# Patient Record
Sex: Female | Born: 2000 | Race: Black or African American | Hispanic: No | Marital: Single | Smoking: Never smoker
Health system: Southern US, Community
[De-identification: ages and names within clinical notes are randomized; demographics above are authoritative.]

## PROBLEM LIST (undated history)

## (undated) ENCOUNTER — Inpatient Hospital Stay (HOSPITAL_COMMUNITY): Payer: Self-pay

## (undated) ENCOUNTER — Emergency Department (HOSPITAL_COMMUNITY): Admission: EM

## (undated) DIAGNOSIS — R51 Headache: Secondary | ICD-10-CM

## (undated) DIAGNOSIS — T7840XA Allergy, unspecified, initial encounter: Secondary | ICD-10-CM

## (undated) DIAGNOSIS — R519 Headache, unspecified: Secondary | ICD-10-CM

## (undated) HISTORY — DX: Headache: R51

## (undated) HISTORY — PX: NO PAST SURGERIES: SHX2092

## (undated) HISTORY — DX: Headache, unspecified: R51.9

## (undated) HISTORY — DX: Allergy, unspecified, initial encounter: T78.40XA

---
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
## 2018-01-01 MED ORDER — TERCONAZOLE 0.8 % VA CREA: 1.0000 | TOPICAL_CREAM | Freq: Every day | VAGINAL | 0 refills | Status: DC

## 2018-01-01 NOTE — Telephone Encounter (Signed)
Please review for refill.  

## 2018-02-05 ENCOUNTER — Ambulatory Visit: Payer: Medicaid Other

## 2018-02-08 ENCOUNTER — Ambulatory Visit (INDEPENDENT_AMBULATORY_CARE_PROVIDER_SITE_OTHER): Payer: Medicaid Other | Admitting: *Deleted

## 2018-02-08 VITALS — BP 118/72 | HR 72 | Wt 120.0 lb

## 2018-02-08 DIAGNOSIS — Z3042 Encounter for surveillance of injectable contraceptive: Secondary | ICD-10-CM

## 2018-02-08 NOTE — Progress Notes (Signed)
Pt is in office for depo injection. Pt is on time for depo.  Pt provided depo for today's visit. Pt tolerated injection well.   Pt advised to RTO July 25-Aug 8 for next depo.  Pt has no other concerns today.  BP 118/72   Pulse 72   Wt 120 lb (54.4 kg)   BMI 24.24 kg/m   Administrations This Visit    medroxyPROGESTERone (DEPO-PROVERA) injection 150 mg    Admin Date 02/08/2018 Action Given Dose 150 mg Route Intramuscular Administered By Lanney Gins, CMA

## 2018-02-12 NOTE — Progress Notes (Signed)
I have reviewed the chart and agree with nursing staff's documentation of this patient's encounter.  Leighton Brickley A Heidy Mccubbin, CNM 02/12/2018 9:03 AM    

## 2018-04-30 ENCOUNTER — Ambulatory Visit: Payer: Medicaid Other

## 2018-05-03 ENCOUNTER — Ambulatory Visit (INDEPENDENT_AMBULATORY_CARE_PROVIDER_SITE_OTHER): Payer: Medicaid Other

## 2018-05-03 DIAGNOSIS — R300 Dysuria: Secondary | ICD-10-CM | POA: Diagnosis not present

## 2018-05-03 DIAGNOSIS — Z3042 Encounter for surveillance of injectable contraceptive: Secondary | ICD-10-CM | POA: Diagnosis not present

## 2018-05-03 LAB — POCT URINALYSIS DIPSTICK
BILIRUBIN UA: NEGATIVE
GLUCOSE UA: NEGATIVE
Ketones, UA: POSITIVE
Leukocytes, UA: NEGATIVE
Nitrite, UA: NEGATIVE
Protein, UA: POSITIVE — AB
RBC UA: POSITIVE
Spec Grav, UA: 1.01 (ref 1.010–1.025)
Urobilinogen, UA: 0.2 E.U./dL
pH, UA: 7.5 (ref 5.0–8.0)

## 2018-05-03 MED ORDER — MEDROXYPROGESTERONE ACETATE 150 MG/ML IM SUSP
150.0000 mg | Freq: Once | INTRAMUSCULAR | Status: AC
  Administered 2018-05-03: 150 mg via INTRAMUSCULAR

## 2018-05-03 NOTE — Progress Notes (Signed)
Pt is here for depo injection. Pt is on time for injection. Injection given without difficulty. Next depo due 10/17-10/31. Pt also feels like she may have a UTI. Urine dip done and sent off for urine culture, advised pt we would call her for results. Pt verbalized understanding.

## 2018-05-05 LAB — URINE CULTURE

## 2018-05-06 ENCOUNTER — Other Ambulatory Visit: Payer: Self-pay | Admitting: Obstetrics

## 2018-05-06 DIAGNOSIS — N3001 Acute cystitis with hematuria: Secondary | ICD-10-CM

## 2018-05-06 MED ORDER — CEFUROXIME AXETIL 500 MG PO TABS: 500.0000 mg | ORAL_TABLET | Freq: Two times a day (BID) | ORAL | 2 refills | Status: DC

## 2018-07-05 ENCOUNTER — Telehealth: Payer: Self-pay

## 2018-07-05 DIAGNOSIS — B379 Candidiasis, unspecified: Secondary | ICD-10-CM

## 2018-07-05 MED ORDER — FLUCONAZOLE 150 MG PO TABS: 150.0000 mg | ORAL_TABLET | Freq: Once | ORAL | 0 refills | Status: AC

## 2018-07-05 NOTE — Telephone Encounter (Signed)
Pt called stating that she is having thick white vaginal discharge and vaginal itching. Sx started a few days ago. Pt is requesting rx for yeast infection. Rx sent to pharmacy per protocol.

## 2018-07-24 ENCOUNTER — Ambulatory Visit: Payer: Medicaid Other

## 2018-07-25 ENCOUNTER — Ambulatory Visit (INDEPENDENT_AMBULATORY_CARE_PROVIDER_SITE_OTHER): Payer: Medicaid Other | Admitting: *Deleted

## 2018-07-25 VITALS — BP 112/72 | HR 71 | Wt 126.0 lb

## 2018-07-25 DIAGNOSIS — Z3042 Encounter for surveillance of injectable contraceptive: Secondary | ICD-10-CM | POA: Diagnosis not present

## 2018-07-25 NOTE — Progress Notes (Signed)
Pt is in office for depo injection.  Pt is on time for injection.  Pt tolerated injection well.  Pt advised to RTO Jan 8-22 for next depo. Pt has no other concerns today.  BP 112/72   Pulse 71   Wt 126 lb (57.2 kg)   BMI 25.45 kg/m   Administrations This Visit    medroxyPROGESTERone (DEPO-PROVERA) injection 150 mg    Admin Date 07/25/2018 Action Given Dose 150 mg Route Intramuscular Administered By Lanney Gins, CMA

## 2018-10-10 ENCOUNTER — Ambulatory Visit: Payer: Medicaid Other

## 2018-10-12 ENCOUNTER — Ambulatory Visit: Payer: Medicaid Other

## 2018-10-16 ENCOUNTER — Ambulatory Visit (INDEPENDENT_AMBULATORY_CARE_PROVIDER_SITE_OTHER): Payer: Medicaid Other

## 2018-10-16 VITALS — BP 101/67 | HR 84 | Wt 129.0 lb

## 2018-10-16 DIAGNOSIS — Z3042 Encounter for surveillance of injectable contraceptive: Secondary | ICD-10-CM | POA: Diagnosis not present

## 2018-10-16 MED ORDER — MEDROXYPROGESTERONE ACETATE 150 MG/ML IM SUSP
150.0000 mg | Freq: Once | INTRAMUSCULAR | Status: AC
  Administered 2018-10-16: 150 mg via INTRAMUSCULAR

## 2018-10-16 NOTE — Progress Notes (Signed)
Presents for DEPO, given in LD, tolerated well.  Next DEPO 04/1-15/2020  Administrations This Visit    medroxyPROGESTERone (DEPO-PROVERA) injection 150 mg    Admin Date 10/16/2018 Action Given Dose 150 mg Route Intramuscular Administered By Swan Zayed J, RMA         

## 2018-12-19 ENCOUNTER — Telehealth: Payer: Self-pay

## 2018-12-19 NOTE — Telephone Encounter (Signed)
Returned call, no answer, left vm 

## 2018-12-21 ENCOUNTER — Telehealth: Payer: Self-pay

## 2018-12-21 DIAGNOSIS — N939 Abnormal uterine and vaginal bleeding, unspecified: Secondary | ICD-10-CM

## 2018-12-21 MED ORDER — NORGESTIMATE-ETH ESTRADIOL 0.25-35 MG-MCG PO TABS: 1.0000 | ORAL_TABLET | Freq: Every day | ORAL | 0 refills | Status: DC

## 2018-12-21 NOTE — Telephone Encounter (Signed)
Returned call, pt states that she needs annual for [facility] refill. Advised of precautions currently with corona virus and scheduling. Sent [facility] refill with approval.

## 2019-01-02 ENCOUNTER — Other Ambulatory Visit: Payer: Self-pay

## 2019-01-02 DIAGNOSIS — Z30013 Encounter for initial prescription of injectable contraceptive: Secondary | ICD-10-CM

## 2019-01-02 MED ORDER — MEDROXYPROGESTERONE ACETATE 150 MG/ML IM SUSP: 150.0000 mg | INTRAMUSCULAR | 1 refills | Status: DC

## 2019-01-02 NOTE — Progress Notes (Signed)
Refill depo x1.

## 2019-01-03 ENCOUNTER — Other Ambulatory Visit: Payer: Self-pay

## 2019-01-03 ENCOUNTER — Ambulatory Visit: Payer: BLUE CROSS/BLUE SHIELD

## 2019-01-03 DIAGNOSIS — Z3042 Encounter for surveillance of injectable contraceptive: Secondary | ICD-10-CM

## 2019-01-03 NOTE — Progress Notes (Signed)
Pt is in the office for depo, administered and pt tolerated well. Next due 6/18-7/2 .Marland Kitchen Administrations This Visit    medroxyPROGESTERone (DEPO-PROVERA) injection 150 mg    Admin Date 01/03/2019 Action Given Dose 150 mg Route Intramuscular Administered By Katrina Stack, RN

## 2019-01-04 NOTE — Progress Notes (Signed)
I have reviewed this chart and agree with the RN/CMA assessment and management.    K. Meryl Davis, M.D. Attending [facility] (Faculty Practice)   

## 2019-01-11 ENCOUNTER — Other Ambulatory Visit: Payer: Self-pay | Admitting: Obstetrics

## 2019-01-11 DIAGNOSIS — N939 Abnormal uterine and vaginal bleeding, unspecified: Secondary | ICD-10-CM

## 2019-01-15 ENCOUNTER — Telehealth: Payer: Self-pay

## 2019-01-15 NOTE — Telephone Encounter (Signed)
  MyChart message sent advising patient that refills were sent to her pharmacy 01/11/19 @ 1:07 pm

## 2019-03-20 ENCOUNTER — Other Ambulatory Visit: Payer: Self-pay | Admitting: Obstetrics

## 2019-03-20 DIAGNOSIS — N3001 Acute cystitis with hematuria: Secondary | ICD-10-CM

## 2019-03-21 ENCOUNTER — Other Ambulatory Visit: Payer: Self-pay

## 2019-03-21 ENCOUNTER — Ambulatory Visit (INDEPENDENT_AMBULATORY_CARE_PROVIDER_SITE_OTHER): Payer: BLUE CROSS/BLUE SHIELD

## 2019-03-21 DIAGNOSIS — Z3042 Encounter for surveillance of injectable contraceptive: Secondary | ICD-10-CM | POA: Diagnosis not present

## 2019-03-21 MED ORDER — MEDROXYPROGESTERONE ACETATE 150 MG/ML IM SUSP
150.0000 mg | Freq: Once | INTRAMUSCULAR | Status: AC
  Administered 2019-03-21: 150 mg via INTRAMUSCULAR

## 2019-03-21 NOTE — Progress Notes (Signed)
Pt is on time for Depo Depo given R Del w/o difficulty Next Depo due Sept 3-17

## 2019-03-25 NOTE — Progress Notes (Signed)
I have reviewed this chart and agree with the RN/CMA assessment and management.    Remmie Bembenek C Angelene Rome, MD, FACOG Attending Physician, Faculty Practice [facility]  

## 2019-06-11 ENCOUNTER — Ambulatory Visit: Payer: BLUE CROSS/BLUE SHIELD

## 2019-07-19 ENCOUNTER — Other Ambulatory Visit: Payer: Self-pay

## 2019-07-19 ENCOUNTER — Encounter: Payer: Self-pay | Admitting: Nurse Practitioner

## 2019-07-19 ENCOUNTER — Ambulatory Visit (INDEPENDENT_AMBULATORY_CARE_PROVIDER_SITE_OTHER): Payer: BLUE CROSS/BLUE SHIELD | Admitting: Nurse Practitioner

## 2019-07-19 VITALS — BP 118/76 | HR 79 | Wt 128.0 lb

## 2019-07-19 DIAGNOSIS — N6452 Nipple discharge: Secondary | ICD-10-CM

## 2019-07-19 DIAGNOSIS — Z3202 Encounter for pregnancy test, result negative: Secondary | ICD-10-CM

## 2019-07-19 LAB — POCT URINE PREGNANCY: Preg Test, Ur: NEGATIVE

## 2019-07-19 NOTE — Progress Notes (Signed)
   GYNECOLOGY OFFICE VISIT NOTE   History:  18 y.o. [id number] here today for evaluation of nipple discharge. Is concerned that she might be pregnant.  Is stopping Depo and is now taking birth control pills almost daily.  She started the pills to handle bleeding that she was having with Depo.  Is working to remember taking the pills daily.  Noticed nipple discharge when she was doing a breast exam and expressing from her right breast two days ago.  Has not noticed any discharge on her clothing. She denies any abnormal vaginal discharge, bleeding, pelvic pain or other concerns.   Past Medical History:  Diagnosis Date  . Allergy    seasonal allergies  . Asthma    currently uses inhaler; states "not often", & doesn't remember day of last use.  Marland Kitchen Headache    migraines; takes Amitryptiline    Past Surgical History:  Procedure Laterality Date  . NO PAST SURGERIES      The following portions of the patient's history were reviewed and updated as appropriate: allergies, current medications, past family history, past medical history, past social history, past surgical history and problem list.   Health Maintenance: Pap not yet indicated at age 33.  Review of Systems:  Pertinent items noted in HPI and remainder of comprehensive ROS otherwise negative.  Objective:  Physical Exam BP 118/76   Pulse 79   Wt 128 lb (58.1 kg)   LMP 07/16/2019   BMI 25.85 kg/m  CONSTITUTIONAL: Well-developed, well-nourished female in no acute distress.  HENT:  Normocephalic, atraumatic. External right and left ear normal.  EYES: Conjunctivae and EOM are normal. Pupils are equal, round.  No scleral icterus.  NECK: Normal range of motion, supple, no masses SKIN: Skin is warm and dry. No rash noted. Not diaphoretic. No erythema. No pallor. NEUROLOGIC: Alert and oriented to person, place, and time. Normal muscle tone coordination. No cranial nerve deficit noted. PSYCHIATRIC: Normal mood and affect. Normal behavior.  Normal judgment and thought content. CARDIOVASCULAR: Normal heart rate noted RESPIRATORY: Effort and breath sounds normal, no problems with respiration noted ABDOMEN: Soft, no distention noted.   PELVIC: Deferred MUSCULOSKELETAL: Normal range of motion. No edema noted. BREAST:  Exam done.  No masses or tenderness.  No nipple discharge from either breast with manual expression from the nipple.  Labs and Imaging No results found.  Assessment & Plan:  1. Breast discharge Normal breast exam today.  Pregnancy test is negative.  Advised to return if breast discharge occurs again.  Advised breast exam once a month and discussed breast stimulation with sex that could contribute to nipple discharge.  - POCT urine pregnancy  2.  Supervision of birth control pills On pills currently with refills until April 2021.  Return for refill early in April or before if needed.  Advised to take pills daily and use condoms until next period for contraception if skipping any pills or taking them late.  Routine preventative health maintenance measures emphasized. Please refer to After Visit Summary for other counseling recommendations.   Return in about 1 year (around 07/18/2020).   Total face-to-face time with patient: 15 minutes.  Over 50% of encounter was spent on counseling and coordination of care.  Earlie Server, RN, MSN, NP-BC Nurse Practitioner, [facility] for [employer] 07/19/2019 10:41AM

## 2020-01-13 ENCOUNTER — Telehealth: Payer: Self-pay

## 2020-01-13 NOTE — Telephone Encounter (Signed)
Returned call, no answer, left vm 

## 2020-01-30 ENCOUNTER — Ambulatory Visit (INDEPENDENT_AMBULATORY_CARE_PROVIDER_SITE_OTHER): Payer: Medicaid Other | Admitting: *Deleted

## 2020-01-30 ENCOUNTER — Other Ambulatory Visit: Payer: Self-pay

## 2020-01-30 DIAGNOSIS — R399 Unspecified symptoms and signs involving the genitourinary system: Secondary | ICD-10-CM

## 2020-01-30 DIAGNOSIS — N926 Irregular menstruation, unspecified: Secondary | ICD-10-CM

## 2020-01-30 DIAGNOSIS — Z3202 Encounter for pregnancy test, result negative: Secondary | ICD-10-CM | POA: Diagnosis not present

## 2020-01-30 LAB — POCT URINALYSIS DIPSTICK
Bilirubin, UA: NEGATIVE
Glucose, UA: NEGATIVE
Ketones, UA: NEGATIVE
Nitrite, UA: NEGATIVE
Protein, UA: POSITIVE — AB
Spec Grav, UA: 1.015 (ref 1.010–1.025)
Urobilinogen, UA: 0.2 E.U./dL
pH, UA: 6.5 (ref 5.0–8.0)

## 2020-01-30 LAB — POCT URINE PREGNANCY: Preg Test, Ur: NEGATIVE

## 2020-01-30 MED ORDER — NITROFURANTOIN MONOHYD MACRO 100 MG PO CAPS: 100.0000 mg | ORAL_CAPSULE | Freq: Two times a day (BID) | ORAL | 0 refills | Status: DC

## 2020-01-30 NOTE — Progress Notes (Signed)
Pt is in office for UTI symptoms and UPT.  Pt states she is having urinary frequency, flank pain and discomfort with urination.   Pt is having irregular cycles after d/c of birth control.    UPT in office today is Negative.  Pt made aware that cycles may be irregular.  Urine dipstick shows negative for all components, positive for leukocytes, red blood cells, protein.  Pt made aware that UC will be sent today. Will be called with results.  Treatment will be sent today due to symptoms. Made aware if any changes to tx need to be made she will be notified.   Macrobid 100mg  sent today.   Pt may follow up as needed.

## 2020-02-01 LAB — URINE CULTURE

## 2021-02-25 ENCOUNTER — Telehealth: Payer: Self-pay

## 2021-02-25 NOTE — Telephone Encounter (Signed)
Depo and pill  No longer on BCP been 1 yr Trying to conceive  Periods are irregular.  Pt advised she needs an appt to discuss irregular cycles. Pt voiced understanding

## 2021-03-15 ENCOUNTER — Encounter: Payer: Self-pay | Admitting: Advanced Practice Midwife

## 2021-03-15 ENCOUNTER — Other Ambulatory Visit (HOSPITAL_COMMUNITY)
Admission: RE | Admit: 2021-03-15 | Discharge: 2021-03-15 | Disposition: A | Discharge status: Home / Self Care | Payer: Medicaid Other | Source: Ambulatory Visit | Attending: Advanced Practice Midwife | Admitting: Advanced Practice Midwife

## 2021-03-15 ENCOUNTER — Other Ambulatory Visit: Payer: Self-pay

## 2021-03-15 ENCOUNTER — Ambulatory Visit (INDEPENDENT_AMBULATORY_CARE_PROVIDER_SITE_OTHER): Payer: Medicaid Other | Admitting: Advanced Practice Midwife

## 2021-03-15 VITALS — BP 126/85 | HR 71 | Wt 134.0 lb

## 2021-03-15 DIAGNOSIS — N914 Secondary oligomenorrhea: Secondary | ICD-10-CM

## 2021-03-15 DIAGNOSIS — N760 Acute vaginitis: Secondary | ICD-10-CM

## 2021-03-15 DIAGNOSIS — N939 Abnormal uterine and vaginal bleeding, unspecified: Secondary | ICD-10-CM | POA: Diagnosis not present

## 2021-03-15 DIAGNOSIS — N926 Irregular menstruation, unspecified: Secondary | ICD-10-CM

## 2021-03-15 DIAGNOSIS — Z124 Encounter for screening for malignant neoplasm of cervix: Secondary | ICD-10-CM

## 2021-03-15 DIAGNOSIS — B9689 Other specified bacterial agents as the cause of diseases classified elsewhere: Secondary | ICD-10-CM

## 2021-03-15 LAB — POCT URINE PREGNANCY: Preg Test, Ur: NEGATIVE

## 2021-03-15 MED ORDER — MEDROXYPROGESTERONE ACETATE 10 MG PO TABS: 10.0000 mg | ORAL_TABLET | Freq: Every day | ORAL | 1 refills | Status: DC

## 2021-03-15 NOTE — Addendum Note (Signed)
Addended by: Marya Landry D on: 03/15/2021 01:20 PM   Modules accepted: Orders

## 2021-03-15 NOTE — Progress Notes (Signed)
  GYNECOLOGY PROGRESS NOTE  History:  20 y.o. G1P0102 presents to [name] office today for problem gyn visit. She reports infrequent menses since delivery of her twins in 2017.  Menses are heavy and painful when she does have them.  She received Nexplanon postpartum in 2017 and had it removed 4 months later due to irregular but frequent menses and other side effects.  She used Depo Provera for contraception after Nexplanon was removed but continued to have irregular and frequent periods.  She tried OCPs while on Depo but periods continued to be irregular, becoming less often but still painful and heavy when they occurred.  She stopped all hormonal birth control in Oct 2020 and had no period until February of 2021, then after this menstrual episode, no period until January 2022. She skipped February and had a period in March of 2022 but no menses since March (x 3 months). She is seeking care now because she has been off hormonal contraception for over a year with infrequent menses and she is trying to conceive.     She has family hx of thyroid disorders and reports a PCP once discussed her thyroid was enlarged but f/u was normal. She denies any fatigue, weight gain or loss, sleep disturbances or temperature intolerance.  The following portions of the patient's history were reviewed and updated as appropriate: allergies, current medications, past family history, past medical history, past social history, past surgical history and problem list. She has never had a Pap due to young age.  Health Maintenance Due  Topic Date Due   Pneumococcal Vaccine 87-54 Years old (1 - PCV) Never done   HPV VACCINES (1 - 2-dose series) Never done   Hepatitis C Screening  Never done   Zoster Vaccines- Shingrix (1 of 2) Never done   PAP-Cervical Cytology Screening  Never done   PAP SMEAR-Modifier  Never done   TETANUS/TDAP  06/25/2020     Review of Systems:  Pertinent items are noted in HPI.   Objective:   Physical Exam Blood pressure 126/85, pulse 71, weight 134 lb (60.8 kg), last menstrual period 12/19/2020. VS reviewed, nursing note reviewed,  Constitutional: well developed, well nourished, no distress HEENT: normocephalic CV: normal rate Pulm/chest wall: normal effort Breast Exam: deferred Abdomen: soft Neuro: alert and oriented x 3 Skin: warm, dry Psych: affect normal Pelvic exam: Cervix pink, visually closed, without lesion, scant white creamy discharge, vaginal walls and external genitalia normal Bimanual exam: Cervix 0/long/high, firm, anterior, neg CMT, uterus nontender, nonenlarged, adnexa without tenderness, enlargement, or mass  Assessment & Plan:  1. Irregular menses - POCT urine pregnancy--negative  2. Encounter for screening for cervical cancer  - Cytology - PAP( [place])  3. Secondary oligomenorrhea --Pt with regular menses from menarche until age 81 after the birth of her twins, then irregular menses with hormonal contraceptives but menses now infrequent, 1-2 times per year without external hormones.  --Labwork today and offered OCPs vs Provera. Pt trying to conceive so would like to try progesterone challenge test. Rx sent to pharmacy.  - TSH - FSH - LH - Testosterone, Free, Total, SHBG - B-HCG Quant - US PELVIC COMPLETE WITH TRANSVAGINAL; Future - medroxyPROGESTERone (PROVERA) 10 MG tablet; Take 1 tablet (10 mg total) by mouth daily.  Dispense: 10 tablet; Refill: 1  4. Abnormal uterine bleeding (AUB) --F/U in 2 months  - US PELVIC COMPLETE WITH TRANSVAGINAL; Future    Sharen Counter, CNM 11:40 AM

## 2021-03-15 NOTE — Progress Notes (Signed)
Pt presents for annual c/o amenorrhea. No period Feb 2021 - Jan 10-19, 2022 No period until March 19-25, 2022 Pt trying to conceive.

## 2021-03-16 LAB — BETA HCG QUANT (REF LAB): hCG Quant: <1 m[IU]/mL

## 2021-03-17 LAB — TESTOSTERONE, FREE, TOTAL, SHBG
Sex Hormone Binding: 41.9 nmol/L (ref 24.6–122.0)
Testosterone, Free: 2.1 pg/mL (ref 0.0–4.2)
Testosterone: 32 ng/dL (ref 13–71)

## 2021-03-17 LAB — FOLLICLE STIMULATING HORMONE: FSH: 6.2 m[IU]/mL

## 2021-03-17 LAB — LUTEINIZING HORMONE: LH: 22.3 m[IU]/mL

## 2021-03-17 LAB — TSH: TSH: 2.49 u[IU]/mL (ref 0.450–4.500)

## 2021-03-19 LAB — CYTOLOGY - PAP
Adequacy: ABSENT
Diagnosis: NEGATIVE

## 2021-03-19 MED ORDER — METRONIDAZOLE 500 MG PO TABS: 500.0000 mg | ORAL_TABLET | Freq: Two times a day (BID) | ORAL | 0 refills | Status: AC

## 2021-03-19 NOTE — Addendum Note (Signed)
Addended by: Sharen Counter A on: 03/19/2021 08:37 PM   Modules accepted: Orders

## 2021-03-25 ENCOUNTER — Ambulatory Visit
Admission: RE | Admit: 2021-03-25 | Discharge: 2021-03-25 | Disposition: A | Discharge status: Home / Self Care | Payer: Medicaid Other | Source: Ambulatory Visit | Attending: Advanced Practice Midwife | Admitting: Advanced Practice Midwife

## 2021-03-25 ENCOUNTER — Other Ambulatory Visit: Payer: Self-pay

## 2021-03-25 DIAGNOSIS — N939 Abnormal uterine and vaginal bleeding, unspecified: Secondary | ICD-10-CM | POA: Insufficient documentation

## 2021-03-25 DIAGNOSIS — N914 Secondary oligomenorrhea: Secondary | ICD-10-CM | POA: Insufficient documentation

## 2021-05-17 ENCOUNTER — Telehealth (INDEPENDENT_AMBULATORY_CARE_PROVIDER_SITE_OTHER): Payer: Medicaid Other | Admitting: Advanced Practice Midwife

## 2021-05-17 ENCOUNTER — Encounter: Payer: Self-pay | Admitting: Advanced Practice Midwife

## 2021-05-17 DIAGNOSIS — N911 Secondary amenorrhea: Secondary | ICD-10-CM

## 2021-05-17 DIAGNOSIS — N970 Female infertility associated with anovulation: Secondary | ICD-10-CM | POA: Insufficient documentation

## 2021-05-17 DIAGNOSIS — N939 Abnormal uterine and vaginal bleeding, unspecified: Secondary | ICD-10-CM | POA: Insufficient documentation

## 2021-05-17 MED ORDER — MEDROXYPROGESTERONE ACETATE 10 MG PO TABS
10.0000 mg | ORAL_TABLET | Freq: Every day | ORAL | 5 refills | Status: DC
Start: 2021-05-17 — End: 2021-10-18

## 2021-05-17 NOTE — Progress Notes (Signed)
    GYNECOLOGY VIRTUAL VISIT ENCOUNTER NOTE  Provider location: [facility]   Patient location: Home  I connected with Arlyn Leak on 05/17/21 at  1:10 PM EDT by MyChart Video Encounter and verified that I am speaking with the correct person using two identifiers.   I discussed the limitations, risks, security and privacy concerns of performing an evaluation and management service virtually and the availability of in person appointments. I also discussed with the patient that there may be a patient responsible charge related to this service. The patient expressed understanding and agreed to proceed.   History:  ERMELINDA ECKERT is a 20 y.o. G38P0102 female being evaluated today for AUB, secondary amenorrhea. She denies any abnormal vaginal discharge, pelvic pain or other concerns.       Past Medical History:  Diagnosis Date   Allergy    seasonal allergies   Asthma    currently uses inhaler; states "not often", & doesn't remember day of last use.   Headache    migraines; takes Amitryptiline   Past Surgical History:  Procedure Laterality Date   NO PAST SURGERIES     The following portions of the patient's history were reviewed and updated as appropriate: allergies, current medications, past family history, past medical history, past social history, past surgical history and problem list.   Health Maintenance:  Normal pap on 03/15/21.   Review of Systems:  Pertinent items noted in HPI and remainder of comprehensive ROS otherwise negative.  Physical Exam:   General:  Alert, oriented and cooperative. Patient appears to be in no acute distress.  Mental Status: Normal mood and affect. Normal behavior. Normal judgment and thought content.   Respiratory: Normal respiratory effort, no problems with respiration noted  Rest of physical exam deferred due to type of encounter  Labs and Imaging No results found for this or any previous visit (from the  past 336 hour(s)). No results found.     Assessment and Plan:     1. Secondary amenorrhea --Pt had menses following 10 days of Provera in June, but no menses since.   --Normal labwork (TSH, FSH, LH, Testosterone) on 03/15/21, normal pelvic US --Pt desires conception at this time --Plan for refills of Provera, pt to take 10 days of Provera as needed to create withdrawal bleed, then try to conceive as desired.  --F/U in 2 months with MD to discuss more options for menses/fertility  - medroxyPROGESTERone (PROVERA) 10 MG tablet; Take 1 tablet (10 mg total) by mouth daily. Take 10 days of Provera every month as needed to induce menstruation.  Dispense: 10 tablet; Refill: 5       I discussed the assessment and treatment plan with the patient. The patient was provided an opportunity to ask questions and all were answered. The patient agreed with the plan and demonstrated an understanding of the instructions.   The patient was advised to call back or seek an in-person evaluation/go to the ED if the symptoms worsen or if the condition fails to improve as anticipated.  I provided 7 minutes of face-to-face time during this encounter.   Sharen Counter, CNM [facility] for [employer], [facility]

## 2021-05-17 NOTE — Progress Notes (Signed)
Patient presents for follow up from AUB. Patient identified with two patient identifiers. Patient states that she took the Provera for the recommended time. She states that she has not had a cycle since she took the last pill. Patient has no other concerns today.

## 2021-09-07 ENCOUNTER — Encounter: Payer: Self-pay | Admitting: Advanced Practice Midwife

## 2021-10-18 ENCOUNTER — Other Ambulatory Visit: Payer: Self-pay

## 2021-10-18 ENCOUNTER — Ambulatory Visit (INDEPENDENT_AMBULATORY_CARE_PROVIDER_SITE_OTHER): Payer: Medicaid Other | Admitting: Obstetrics and Gynecology

## 2021-10-18 ENCOUNTER — Encounter: Payer: Self-pay | Admitting: Advanced Practice Midwife

## 2021-10-18 VITALS — BP 107/70 | HR 77 | Wt 147.0 lb

## 2021-10-18 DIAGNOSIS — N911 Secondary amenorrhea: Secondary | ICD-10-CM

## 2021-10-18 DIAGNOSIS — N939 Abnormal uterine and vaginal bleeding, unspecified: Secondary | ICD-10-CM

## 2021-10-18 DIAGNOSIS — N970 Female infertility associated with anovulation: Secondary | ICD-10-CM

## 2021-10-18 MED ORDER — MEDROXYPROGESTERONE ACETATE 10 MG PO TABS: ORAL_TABLET | ORAL | 1 refills | Status: DC

## 2021-10-18 NOTE — Progress Notes (Signed)
Pt presents for irregular menses. She is having cycles every other month. Pt is not interested in [facility] at this time.  Normal pap 03/15/2021

## 2021-10-18 NOTE — Progress Notes (Signed)
[name] presents for follow of her AUB, secondary amenorrhea and desire for pregnancy. See prior office notes. Pt has positive withdrawn from Provera in Dec. She has not take Provera again because she was not sure when/how to start.  Pt still desires pregnancy. Last IC 1 week ago Twin gestation 6 yrs ago. Current partner, 24 healthy, no prior children  Pt had normal labs with last visit GYN U/S 6/22 normal Pap smear UTD.  H/O Depo provera and Nexplanon use in the past. No Depo provera since 07/2019.  PE AF VSS Lungs clear Heart RRR Abd Soft + BS   A/P AUB        Secondary amenorrhea         Desire for pregnancy  Reviewed how to start and take Provera. To take UPT, if negative start Provera. Will check Progesterone and anti mullerian hormone on CD 21. Pt to call office and schedule based on bleeding form Provera. Discussed need for semen analysis as well. Pt declines at this time. F/U per test results. Instructed to start PNV qd.

## 2021-12-10 ENCOUNTER — Other Ambulatory Visit: Payer: Self-pay | Admitting: Obstetrics and Gynecology

## 2021-12-10 DIAGNOSIS — N911 Secondary amenorrhea: Secondary | ICD-10-CM

## 2021-12-10 DIAGNOSIS — N939 Abnormal uterine and vaginal bleeding, unspecified: Secondary | ICD-10-CM

## 2022-03-17 ENCOUNTER — Telehealth: Payer: Self-pay | Admitting: *Deleted

## 2022-03-17 ENCOUNTER — Encounter: Payer: Self-pay | Admitting: Advanced Practice Midwife

## 2022-03-17 DIAGNOSIS — N3000 Acute cystitis without hematuria: Secondary | ICD-10-CM

## 2022-03-17 MED ORDER — NITROFURANTOIN MONOHYD MACRO 100 MG PO CAPS: 100.0000 mg | ORAL_CAPSULE | Freq: Two times a day (BID) | ORAL | 1 refills | Status: DC

## 2022-03-17 NOTE — Telephone Encounter (Signed)
TC from pt requesting nurse visit for UTI symptoms. Office closed in PM. Pt could not come until PM. Dr. Clearance Coots consulted. Approves RX Macrobid for UTI. Pt advised to pick up RX, drink plenty of water, avoid caffeine and OJ, may take AZO OTC in addition to RX if needed. Pt verbalized understanding.

## 2022-03-22 ENCOUNTER — Encounter: Payer: Self-pay | Admitting: Advanced Practice Midwife

## 2022-03-22 ENCOUNTER — Ambulatory Visit (INDEPENDENT_AMBULATORY_CARE_PROVIDER_SITE_OTHER): Payer: Medicaid Other | Admitting: Advanced Practice Midwife

## 2022-03-22 ENCOUNTER — Other Ambulatory Visit (HOSPITAL_COMMUNITY)
Admission: RE | Admit: 2022-03-22 | Discharge: 2022-03-22 | Disposition: A | Discharge status: Home / Self Care | Payer: Medicaid Other | Source: Ambulatory Visit | Attending: Advanced Practice Midwife | Admitting: Advanced Practice Midwife

## 2022-03-22 VITALS — BP 119/77 | HR 70 | Ht 59.0 in | Wt 146.8 lb

## 2022-03-22 DIAGNOSIS — N898 Other specified noninflammatory disorders of vagina: Secondary | ICD-10-CM | POA: Insufficient documentation

## 2022-03-22 DIAGNOSIS — N939 Abnormal uterine and vaginal bleeding, unspecified: Secondary | ICD-10-CM | POA: Diagnosis not present

## 2022-03-22 DIAGNOSIS — N970 Female infertility associated with anovulation: Secondary | ICD-10-CM

## 2022-03-22 DIAGNOSIS — A749 Chlamydial infection, unspecified: Secondary | ICD-10-CM

## 2022-03-22 DIAGNOSIS — N3000 Acute cystitis without hematuria: Secondary | ICD-10-CM

## 2022-03-22 DIAGNOSIS — N911 Secondary amenorrhea: Secondary | ICD-10-CM

## 2022-03-22 MED ORDER — MEDROXYPROGESTERONE ACETATE 10 MG PO TABS: ORAL_TABLET | ORAL | 5 refills | Status: DC

## 2022-03-22 MED ORDER — LETROZOLE 2.5 MG PO TABS: 2.5000 mg | ORAL_TABLET | Freq: Every day | ORAL | 1 refills | Status: DC

## 2022-03-22 NOTE — Progress Notes (Signed)
   GYNECOLOGY PROGRESS NOTE  History:  21 y.o. G1P0102 presents to [place] office today for problem gyn visit. She reports irregular menses continues and she desires pregnancy and has been unable to become pregnant.  She is using an ovulation kit and last month there was a faint second line on the test.  She usually  has withdrawal bleeding immediately after Provera x 10 days, but last month it was weeks after the medicine before menses started.  She denies h/a, dizziness, shortness of breath, n/v, or fever/chills.    The following portions of the patient's history were reviewed and updated as appropriate: allergies, current medications, past family history, past medical history, past social history, past surgical history and problem list. Last pap smear on 03/15/21 was normal.  Health Maintenance Due  Topic Date Due   HPV VACCINES (1 - Risk 3-dose series) Never done   Hepatitis C Screening  Never done   TETANUS/TDAP  06/25/2020   COVID-19 Vaccine (3 - Pfizer risk series) 12/31/2021     Review of Systems:  Pertinent items are noted in HPI.   Objective:  Physical Exam Blood pressure 119/77, pulse 70, height _0  (1.499 m), weight 146 lb 12.8 oz (66.6 kg), last menstrual period 02/20/2022. VS reviewed, nursing note reviewed,  Constitutional: well developed, well nourished, no distress HEENT: normocephalic CV: normal rate Pulm/chest wall: normal effort Breast Exam: deferred Abdomen: soft Neuro: alert and oriented x 3 Skin: warm, dry Psych: affect normal Pelvic exam: Deferred  Assessment & Plan:    1. Female infertility associated with anovulation --Pt saw Dr Rip Harbour 10/18/21 and he recommended  progesterone and anti mullerian labs on cycle day 21.    --Pt is approximately on day 21 now --Discussed option of using ovulation stimulating medications and pt would like to try --Rx for Provera renewed, pt to take if menses is delayed, then take Femara for 5 days starting cycle day  3 --Continue use of ovulation kit to look for [facility] surge, and time intercourse for 2-3 days after positive test  - letrozole (FEMARA) 2.5 MG tablet; Take 1 tablet (2.5 mg total) by mouth daily. Start on Day 3 of your cycle (with Day 1 as the first day of your period) and take daily for five days  Dispense: 5 tablet; Refill: 1 - medroxyPROGESTERone (PROVERA) 10 MG tablet; TAKE 1 TABLET BY MOUTH EVERY DAY FOR 10 DAYS  Dispense: 10 tablet; Refill: 5 - Progesterone - Anti mullerian hormone  2. Vaginal irritation  - Cervicovaginal ancillary only( [place])  3. Acute cystitis without hematuria --Recent UTI, took abx but some irritation vaginally and near urethra  - Urine Culture  4. Abnormal uterine bleeding (AUB)  - medroxyPROGESTERone (PROVERA) 10 MG tablet; TAKE 1 TABLET BY MOUTH EVERY DAY FOR 10 DAYS  Dispense: 10 tablet; Refill: 5  5. Secondary amenorrhea  - medroxyPROGESTERone (PROVERA) 10 MG tablet; TAKE 1 TABLET BY MOUTH EVERY DAY FOR 10 DAYS  Dispense: 10 tablet; Refill: 5    Return in about 3 months (around 06/22/2022) for Gyn follow up for Abnormal Uterine Bleeding with me.   Fatima Blank, CNM 2:32 PM

## 2022-03-22 NOTE — Progress Notes (Signed)
Pt presents for GYN f/u. Pt states here cycles have not regulated since staring Provera. Pt states she has a period the first 2-3 months while using Provera but she has not been taking medication consistently.

## 2022-03-25 LAB — CERVICOVAGINAL ANCILLARY ONLY
Bacterial Vaginitis (gardnerella): POSITIVE — AB
Candida Glabrata: NEGATIVE
Candida Vaginitis: NEGATIVE
Chlamydia: POSITIVE — AB
Comment: NEGATIVE
Comment: NEGATIVE
Comment: NEGATIVE
Comment: NEGATIVE
Comment: NEGATIVE
Comment: NORMAL
Neisseria Gonorrhea: NEGATIVE
Trichomonas: NEGATIVE

## 2022-03-26 MED ORDER — DOXYCYCLINE HYCLATE 100 MG PO CAPS: 100.0000 mg | ORAL_CAPSULE | Freq: Two times a day (BID) | ORAL | 0 refills | Status: DC

## 2022-03-27 LAB — PROGESTERONE: Progesterone: 0.3 ng/mL

## 2022-03-27 LAB — ANTI MULLERIAN HORMONE: ANTI-MULLERIAN HORMONE (AMH): 6.66 ng/mL

## 2022-03-28 ENCOUNTER — Encounter: Payer: Self-pay | Admitting: Advanced Practice Midwife

## 2022-04-28 ENCOUNTER — Encounter: Payer: Self-pay | Admitting: Advanced Practice Midwife

## 2022-04-29 ENCOUNTER — Other Ambulatory Visit: Payer: Self-pay | Admitting: Emergency Medicine

## 2022-04-29 DIAGNOSIS — B9689 Other specified bacterial agents as the cause of diseases classified elsewhere: Secondary | ICD-10-CM

## 2022-04-29 MED ORDER — METRONIDAZOLE 500 MG PO TABS: 500.0000 mg | ORAL_TABLET | Freq: Two times a day (BID) | ORAL | 0 refills | Status: DC

## 2022-05-04 ENCOUNTER — Telehealth: Payer: Self-pay

## 2022-05-04 NOTE — Telephone Encounter (Signed)
S/w pt and advised of side effects associated with taking flagyl

## 2022-05-04 NOTE — Telephone Encounter (Signed)
Returned call about med questions, no answer, left vm

## 2022-06-20 ENCOUNTER — Ambulatory Visit: Payer: Medicaid Other | Admitting: Advanced Practice Midwife

## 2022-08-24 ENCOUNTER — Other Ambulatory Visit: Payer: Self-pay | Admitting: Emergency Medicine

## 2022-08-24 MED ORDER — METRONIDAZOLE 500 MG PO TABS: 500.0000 mg | ORAL_TABLET | Freq: Two times a day (BID) | ORAL | 0 refills | Status: DC

## 2022-08-24 NOTE — Progress Notes (Signed)
Pt calls and reports fishy vaginal odor, discharge.  Unable to schedule self swab, as office will be closed rest of week for Thanksgiving holiday. Rx for BV sent per protocol.

## 2022-12-12 ENCOUNTER — Encounter: Payer: Self-pay | Admitting: Advanced Practice Midwife

## 2022-12-13 ENCOUNTER — Other Ambulatory Visit (HOSPITAL_BASED_OUTPATIENT_CLINIC_OR_DEPARTMENT_OTHER): Payer: Self-pay | Admitting: Advanced Practice Midwife

## 2022-12-13 DIAGNOSIS — N970 Female infertility associated with anovulation: Secondary | ICD-10-CM

## 2022-12-13 MED ORDER — LETROZOLE 2.5 MG PO TABS: 2.5000 mg | ORAL_TABLET | Freq: Every day | ORAL | 1 refills | Status: DC

## 2022-12-23 ENCOUNTER — Encounter: Payer: Self-pay | Admitting: Advanced Practice Midwife

## 2022-12-23 ENCOUNTER — Telehealth: Payer: Self-pay

## 2022-12-23 NOTE — Telephone Encounter (Signed)
S/w pt about irregular bleeding and clotting, pt states that only has light bleeding right now. Advised that it may be her cycle attempting to return after taking the medication sent by provider. Pt has follow up appt on 01/09/23, advised that she will be added to cancellation list and contacted if earlier appt comes up. Advised to be evaluated at [facility] if symtpoms become worse, pt agreed.

## 2023-01-09 ENCOUNTER — Encounter: Payer: Self-pay | Admitting: Advanced Practice Midwife

## 2023-01-09 ENCOUNTER — Ambulatory Visit (INDEPENDENT_AMBULATORY_CARE_PROVIDER_SITE_OTHER): Payer: Medicaid Other | Admitting: Advanced Practice Midwife

## 2023-01-09 VITALS — BP 128/83 | HR 74 | Ht 59.0 in | Wt 154.4 lb

## 2023-01-09 DIAGNOSIS — N970 Female infertility associated with anovulation: Secondary | ICD-10-CM | POA: Diagnosis not present

## 2023-01-09 DIAGNOSIS — N930 Postcoital and contact bleeding: Secondary | ICD-10-CM

## 2023-01-09 DIAGNOSIS — N911 Secondary amenorrhea: Secondary | ICD-10-CM

## 2023-01-09 DIAGNOSIS — N939 Abnormal uterine and vaginal bleeding, unspecified: Secondary | ICD-10-CM | POA: Diagnosis not present

## 2023-01-09 MED ORDER — LETROZOLE 2.5 MG PO TABS: 2.5000 mg | ORAL_TABLET | Freq: Every day | ORAL | 1 refills | Status: DC

## 2023-01-09 MED ORDER — MEDROXYPROGESTERONE ACETATE 10 MG PO TABS: ORAL_TABLET | ORAL | 5 refills | Status: DC

## 2023-01-09 NOTE — Progress Notes (Addendum)
   GYNECOLOGY PROGRESS NOTE  History:  22 y.o. G1P0102 presents to [name] office today for problem gyn visit. She reports needing to take Provera sometimes to trigger menses but sometimes she cycles on her own.  She took Femara x 2 rounds. The first round, she did not see evidence of ovulation but she had a positive LH surge the second round and did try to conceive.  She then had bleeding with clots for 3-4 days after intercourse.  She has not had menses since then, less than one month ago.  She denies h/a, dizziness, shortness of breath, n/v, or fever/chills.    The following portions of the patient's history were reviewed and updated as appropriate: allergies, current medications, past family history, past medical history, past social history, past surgical history and problem list. Last pap smear on 03/15/21 was normal.  Health Maintenance Due  Topic Date Due   HPV VACCINES (1 - Risk 3-dose series) Never done   Hepatitis C Screening  Never done   DTaP/Tdap/Td (7 - Td or Tdap) 06/25/2020   COVID-19 Vaccine (3 - Pfizer risk series) 12/31/2021     Review of Systems:  Pertinent items are noted in HPI.   Objective:  Physical Exam Blood pressure 128/83, pulse 74, height 4\' 11"  (1.499 m), weight 154 lb 6.4 oz (70 kg), last menstrual period 12/11/2022. VS reviewed, nursing note reviewed,  Constitutional: well developed, well nourished, no distress HEENT: normocephalic CV: normal rate Pulm/chest wall: normal effort Breast Exam: deferred Abdomen: soft Neuro: alert and oriented x 3 Skin: warm, dry Psych: affect normal Pelvic exam: Deferred  Assessment & Plan:  1. Female infertility associated with anovulation  - medroxyPROGESTERone (PROVERA) 10 MG tablet; TAKE 1 TABLET BY MOUTH EVERY DAY FOR 10 DAYS  Dispense: 10 tablet; Refill: 5 - letrozole (FEMARA) 2.5 MG tablet; Take 1 tablet (2.5 mg total) by mouth daily. Start on Day 3 of your cycle (with Day 1 as the first day of your period)  and take daily for five days  Dispense: 5 tablet; Refill: 1  2. Secondary amenorrhea  - medroxyPROGESTERone (PROVERA) 10 MG tablet; TAKE 1 TABLET BY MOUTH EVERY DAY FOR 10 DAYS  Dispense: 10 tablet; Refill: 5  3. Abnormal uterine bleeding (AUB) --Bleeding after intercourse likely associated with irregular cycles and uterine lining.   --Pt to try Femara again, and notify CNM if bleeding happens again with next attempt at conception, or if it is happening at other times. --F/U with MD in 3 months, if pregnancy not achieved and/or if irregular bleeding/bleeding after intercourse persist. - medroxyPROGESTERone (PROVERA) 10 MG tablet; TAKE 1 TABLET BY MOUTH EVERY DAY FOR 10 DAYS  Dispense: 10 tablet; Refill: 5    4. Postcoital bleeding  - US PELVIC COMPLETE WITH TRANSVAGINAL; Future    Return in about 3 months (around 04/10/2023) for MD only, f/u for AUB, fertility.   Sharen Counter, CNM 6:05 PM

## 2023-01-09 NOTE — Progress Notes (Signed)
Pt presents for f/u to discuss new medication for ovulation.  Also reports abdominal pain and post coital bleeding.

## 2023-01-09 NOTE — Addendum Note (Signed)
Addended by: Sharen Counter A on: 01/09/2023 06:12 PM   Modules accepted: Orders

## 2023-01-12 ENCOUNTER — Ambulatory Visit (HOSPITAL_COMMUNITY): Admission: RE | Admit: 2023-01-12 | Payer: Medicaid Other | Source: Ambulatory Visit

## 2023-01-17 ENCOUNTER — Encounter: Payer: Self-pay | Admitting: Advanced Practice Midwife

## 2023-01-18 ENCOUNTER — Ambulatory Visit (HOSPITAL_COMMUNITY)
Admission: RE | Admit: 2023-01-18 | Discharge: 2023-01-18 | Disposition: A | Discharge status: Home / Self Care | Payer: Medicaid Other | Source: Ambulatory Visit | Attending: Advanced Practice Midwife | Admitting: Advanced Practice Midwife

## 2023-01-18 DIAGNOSIS — N930 Postcoital and contact bleeding: Secondary | ICD-10-CM | POA: Insufficient documentation

## 2023-01-18 DIAGNOSIS — N939 Abnormal uterine and vaginal bleeding, unspecified: Secondary | ICD-10-CM | POA: Diagnosis present

## 2023-03-21 ENCOUNTER — Other Ambulatory Visit: Payer: Self-pay | Admitting: Obstetrics and Gynecology

## 2023-03-21 DIAGNOSIS — N760 Acute vaginitis: Secondary | ICD-10-CM

## 2023-03-21 MED ORDER — METRONIDAZOLE 500 MG PO TABS
500.0000 mg | ORAL_TABLET | Freq: Two times a day (BID) | ORAL | 0 refills | Status: DC
Start: 2023-03-21 — End: 2023-07-26

## 2023-04-12 ENCOUNTER — Encounter: Payer: Self-pay | Admitting: Advanced Practice Midwife

## 2023-04-14 ENCOUNTER — Other Ambulatory Visit: Payer: Self-pay | Admitting: Advanced Practice Midwife

## 2023-04-14 DIAGNOSIS — N970 Female infertility associated with anovulation: Secondary | ICD-10-CM

## 2023-04-14 MED ORDER — LETROZOLE 2.5 MG PO TABS: 5.0000 mg | ORAL_TABLET | Freq: Every day | ORAL | 0 refills | Status: AC

## 2023-05-08 ENCOUNTER — Ambulatory Visit: Payer: Medicaid Other | Admitting: Obstetrics and Gynecology

## 2023-06-06 ENCOUNTER — Encounter: Payer: Self-pay | Admitting: Advanced Practice Midwife

## 2023-07-04 ENCOUNTER — Ambulatory Visit: Payer: Medicaid Other | Admitting: Obstetrics and Gynecology

## 2023-07-25 ENCOUNTER — Other Ambulatory Visit (HOSPITAL_COMMUNITY)
Admission: RE | Admit: 2023-07-25 | Discharge: 2023-07-25 | Disposition: A | Discharge status: Home / Self Care | Payer: Medicaid Other | Source: Ambulatory Visit | Attending: Obstetrics and Gynecology | Admitting: Obstetrics and Gynecology

## 2023-07-25 ENCOUNTER — Ambulatory Visit: Payer: Medicaid Other

## 2023-07-25 ENCOUNTER — Ambulatory Visit: Payer: Medicaid Other | Admitting: Emergency Medicine

## 2023-07-25 VITALS — BP 123/79 | Wt 156.0 lb

## 2023-07-25 DIAGNOSIS — R300 Dysuria: Secondary | ICD-10-CM | POA: Diagnosis not present

## 2023-07-25 DIAGNOSIS — Z113 Encounter for screening for infections with a predominantly sexual mode of transmission: Secondary | ICD-10-CM | POA: Insufficient documentation

## 2023-07-25 LAB — POCT URINALYSIS DIPSTICK
Bilirubin, UA: NEGATIVE
Blood, UA: POSITIVE
Glucose, UA: NEGATIVE
Ketones, UA: NEGATIVE
Nitrite, UA: POSITIVE
Protein, UA: POSITIVE — AB
Spec Grav, UA: 1.015 (ref 1.010–1.025)
Urobilinogen, UA: 0.2 U/dL
pH, UA: 6 (ref 5.0–8.0)

## 2023-07-25 MED ORDER — PHENAZOPYRIDINE HCL 200 MG PO TABS: 200.0000 mg | ORAL_TABLET | Freq: Three times a day (TID) | ORAL | 0 refills | Status: DC | PRN

## 2023-07-25 MED ORDER — NITROFURANTOIN MONOHYD MACRO 100 MG PO CAPS: 100.0000 mg | ORAL_CAPSULE | Freq: Two times a day (BID) | ORAL | 0 refills | Status: DC

## 2023-07-25 NOTE — Progress Notes (Signed)
SUBJECTIVE:  22 y.o. female complains of vaginal irritation and itching x 5 days.  Denies abnormal vaginal bleeding or significant pelvic pain or fever. Reports dysuria for 1 day, frequent urination and urgency for 3 days. Denies history of known exposure to STD.  Patient's last menstrual period was 06/26/2023 (exact date).  OBJECTIVE:  She appears well, afebrile. Urine dipstick: positive for WBC's, positive for RBC's, positive for protein, positive for nitrates, and positive for leukocytes.  ASSESSMENT:  Vaginal Discharge  Vaginal Odor   PLAN:  GC, chlamydia, trichomonas, BVAG, CVAG probe sent to lab. Treatment: To be determined once lab results are received ROV prn if symptoms persist or worsen.

## 2023-07-26 ENCOUNTER — Encounter: Payer: Self-pay | Admitting: Advanced Practice Midwife

## 2023-07-26 LAB — CERVICOVAGINAL ANCILLARY ONLY
Bacterial Vaginitis (gardnerella): POSITIVE — AB
Chlamydia: NEGATIVE
Comment: NEGATIVE
Comment: NEGATIVE
Comment: NEGATIVE
Comment: NORMAL
Neisseria Gonorrhea: NEGATIVE
Trichomonas: NEGATIVE

## 2023-07-26 MED ORDER — METRONIDAZOLE 500 MG PO TABS
500.0000 mg | ORAL_TABLET | Freq: Two times a day (BID) | ORAL | 0 refills | Status: DC
Start: 2023-07-26 — End: 2023-08-04

## 2023-07-26 NOTE — Addendum Note (Signed)
Addended by: Catalina Antigua on: 07/26/2023 02:51 PM   Modules accepted: Orders

## 2023-07-29 LAB — URINE CULTURE

## 2023-08-04 ENCOUNTER — Encounter: Payer: Self-pay | Admitting: Obstetrics and Gynecology

## 2023-08-04 ENCOUNTER — Ambulatory Visit (INDEPENDENT_AMBULATORY_CARE_PROVIDER_SITE_OTHER): Payer: Medicaid Other | Admitting: Obstetrics and Gynecology

## 2023-08-04 VITALS — BP 111/73 | HR 85 | Wt 159.4 lb

## 2023-08-04 DIAGNOSIS — N914 Secondary oligomenorrhea: Secondary | ICD-10-CM

## 2023-08-04 DIAGNOSIS — N970 Female infertility associated with anovulation: Secondary | ICD-10-CM

## 2023-08-04 MED ORDER — LETROZOLE 2.5 MG PO TABS: 5.0000 mg | ORAL_TABLET | Freq: Every day | ORAL | 2 refills | Status: AC

## 2023-08-04 NOTE — Progress Notes (Signed)
   RETURN GYNECOLOGY VISIT  Subjective:  Lori Powell is a 22 y.o. [phone] with LMP 07/26/23 presenting for follow up of oligomenorrhea and infertilty (anovulatory)  Has been following with Dr. Alysia Penna and Sharen Counter for secondary oligomenorrhea since 2022. I personally reviewed the following components of her work up: - 03/15/21 Pap NILM - 03/15/21 TSH 2.49 - 03/14/21 FSH 6.2 - 03/14/21 total T 32, free T 2.1 - 03/15/21 hCG <1 - 03/22/22 D21 progesterone 0.3 - 01/18/23 normal pelvic US  She is TTC and taking PNV. She has been doing cyclic provera to trigger withdrawal bleeds monthly and recently started OI. She ovulated once with femara 5mg  and then had spontaneous menses 2 weeks later. She has questions about timing of IC.   Objective:   Vitals:   08/04/23 0909  BP: 111/73  Pulse: 85  Weight: 159 lb 6.4 oz (72.3 kg)   General:  Alert, oriented and cooperative. Patient is in no acute distress.  Skin: Skin is warm and dry. No rash noted.   Cardiovascular: Normal heart rate noted  Respiratory: Normal respiratory effort, no problems with respiration noted   Assessment and Plan:  [name] is a 23 y.o. with secondary anovulatory infertility & secondary oligomenorrhea  We discussed options for management. The patient has just had 1 ovulatory cycle with femara 5mg . She would like to conceive in the near future, so would like to continue OI with timed intercourse. She is menstruating after ovulatory cycles, so we can stop monthly provera.  Recommend semen analysis for partner Patient is already on D10 of her cycle, so will wait to do letrozole until next cycle.  Reviewed that best time to conceive is the 2 days before and day of ovulation - IC AFTER ovulation is too late!  Discussed max of 6 ovulatory cycles before needing to see REI -     letrozole (FEMARA) 2.5 MG tablet; Take 2 tablets (5 mg total) by mouth daily for 5 days. Start on day 3 of your period. Take a  pregnancy test before starting  Return in about 6 months (around 02/01/2024) for follow up fertility.  Lennart Pall, MD

## 2023-08-04 NOTE — Progress Notes (Signed)
Pt is in the office to follow up from visit in April. Pt has been taking Provera and Femara to try and regulate cycles but reports still having irregular bleeding. LMP 07/26/23

## 2023-11-06 ENCOUNTER — Other Ambulatory Visit: Payer: Self-pay | Admitting: Obstetrics and Gynecology

## 2024-08-27 ENCOUNTER — Ambulatory Visit (INDEPENDENT_AMBULATORY_CARE_PROVIDER_SITE_OTHER): Payer: Self-pay

## 2024-08-27 VITALS — BP 128/82 | HR 80 | Wt 154.0 lb

## 2024-08-27 DIAGNOSIS — Z3201 Encounter for pregnancy test, result positive: Secondary | ICD-10-CM

## 2024-08-27 DIAGNOSIS — Z3200 Encounter for pregnancy test, result unknown: Secondary | ICD-10-CM

## 2024-08-27 LAB — POCT URINE PREGNANCY: Preg Test, Ur: POSITIVE — AB

## 2024-08-27 NOTE — Progress Notes (Signed)
..  Lori Powell presents today for UPT. She has no unusual complaints. LMP: 07/01/24    OBJECTIVE: Appears well, in no apparent distress.  OB History     Gravida  2   Para  1   Term  0   Preterm  1   AB  0   Living  2      SAB  0   IAB  0   Ectopic  0   Multiple  1   Live Births  2          Home UPT Result: Positive In-Office UPT result: Positive I have reviewed the patient's medical, obstetrical, social, and family histories, and medications.   ASSESSMENT: Positive pregnancy test  PLAN Prenatal care to be completed at: [name]

## 2024-09-15 ENCOUNTER — Inpatient Hospital Stay (HOSPITAL_COMMUNITY)
Admission: AD | Admit: 2024-09-15 | Discharge: 2024-09-15 | Disposition: A | Discharge status: Home / Self Care | Attending: Obstetrics & Gynecology | Admitting: Obstetrics & Gynecology

## 2024-09-15 ENCOUNTER — Inpatient Hospital Stay (HOSPITAL_COMMUNITY)

## 2024-09-15 DIAGNOSIS — Z3A08 8 weeks gestation of pregnancy: Secondary | ICD-10-CM

## 2024-09-15 DIAGNOSIS — Z3A10 10 weeks gestation of pregnancy: Secondary | ICD-10-CM | POA: Diagnosis not present

## 2024-09-15 DIAGNOSIS — O26851 Spotting complicating pregnancy, first trimester: Secondary | ICD-10-CM

## 2024-09-15 DIAGNOSIS — Z3490 Encounter for supervision of normal pregnancy, unspecified, unspecified trimester: Secondary | ICD-10-CM

## 2024-09-15 DIAGNOSIS — N939 Abnormal uterine and vaginal bleeding, unspecified: Secondary | ICD-10-CM

## 2024-09-15 LAB — CBC
HCT: 38.5 % (ref 36.0–46.0)
Hemoglobin: 13.2 g/dL (ref 12.0–15.0)
MCH: 30.5 pg (ref 26.0–34.0)
MCHC: 34.3 g/dL (ref 30.0–36.0)
MCV: 88.9 fL (ref 80.0–100.0)
Platelets: 364 K/uL (ref 150–400)
RBC: 4.33 MIL/uL (ref 3.87–5.11)
RDW: 12.7 % (ref 11.5–15.5)
WBC: 12 K/uL — ABNORMAL HIGH (ref 4.0–10.5)
nRBC: 0 % (ref 0.0–0.2)

## 2024-09-15 LAB — WET PREP, GENITAL
Clue Cells Wet Prep HPF POC: NONE SEEN
Sperm: NONE SEEN
Trich, Wet Prep: NONE SEEN
WBC, Wet Prep HPF POC: <10 (ref <10)
Yeast Wet Prep HPF POC: NONE SEEN

## 2024-09-15 LAB — URINALYSIS, ROUTINE W REFLEX MICROSCOPIC
Bilirubin Urine: NEGATIVE
Glucose, UA: NEGATIVE mg/dL
Hgb urine dipstick: NEGATIVE
Ketones, ur: NEGATIVE mg/dL
Leukocytes,Ua: NEGATIVE
Nitrite: NEGATIVE
Protein, ur: NEGATIVE mg/dL
Specific Gravity, Urine: 1.016 (ref 1.005–1.030)
pH: 7 (ref 5.0–8.0)

## 2024-09-15 LAB — HCG, QUANTITATIVE, PREGNANCY: hCG, Beta Chain, Quant, S: 115614 m[IU]/mL — ABNORMAL HIGH (ref <5)

## 2024-09-15 NOTE — Discharge Instructions (Signed)
 VAGINAL BLEEDING DURING PREGNANCY: A small amount of bleeding from the vagina is common during pregnancy. Sometimes the bleeding is normal and does not cause problems. At other times, though, bleeding may be a sign of something serious. EARLY PREGNANCY (FIRST TRIMESTER) Normal bleeding in pregnancy can happen: When the fertilized egg attaches itself to your uterus. When blood vessels change because of the pregnancy. When you have pelvic exams. When you have sex. Abnormal bleeding can happen: When you have an infection. When you have growths in your womb. If you're having a miscarriage or at risk of having one. If you have other problems in your pregnancy. Your labs and ultrasound today were reassuring that the pregnancy is developing normally right now. Based on the measurements on the ultrasound, you are at 8 weeks 3 days of pregnancy with a due date of April 24, 2025.  Reasons to return to [facility]: If you have heavier bleeding that soaks through more that 2 pads per hour for an hour or more If you bleed so much that you feel like you might pass out or you do pass out If you have significant abdominal pain that is not improved with Tylenol 1000 mg every 8 hours as needed for pain If you develop a fever > 100.5

## 2024-09-15 NOTE — MAU Provider Note (Signed)
 Chief Complaint:  Vaginal Bleeding   HPI   None     Lori Powell is a 23 y.o. [id number] at [redacted]w[redacted]d who presents to maternity admissions reporting vaginal bleeding. She reports she saw blood on the toilet paper after wiping at around 1400 today. Denies abdominal pain or prior vaginal bleeding. She had a private US  at another facility a few weeks ago, first prenatal visit scheduled for next week. No US  in our system yet. Denies recent intercourse, vaginal exams, changes in vaginal discharge. She reports that she has not had any more vaginal bleeding since that one incident this morning.  Pregnancy Course: Receives care at [facility] for [facility] . Prenatal records reviewed.   Past Medical History:  Diagnosis Date   Allergy    seasonal allergies   Asthma    currently uses inhaler; states not often, & doesn't remember day of last use.   Headache    migraines; takes Amitryptiline   OB History  Gravida Para Term Preterm AB Living  2 1 0 1 0 2  SAB IAB Ectopic Multiple Live Births  0 0 0 1 2    # Outcome Date GA Lbr Len/2nd Weight Sex Type Anes PTL Lv  2 Current           1A Preterm 12/09/15 [redacted]w[redacted]d 12:07 / 01:09 2515 g F Vag-Spont EPI  LIV  1B Preterm 12/09/15 [redacted]w[redacted]d 12:07 / 01:12 2330 g F Vag-Spont EPI  LIV   Past Surgical History:  Procedure Laterality Date   NO PAST SURGERIES     Family History  Problem Relation Age of Onset   Allergies Father    Heart disease Maternal Grandmother    Hypertension Maternal Grandmother    Diabetes Maternal Grandmother    Migraines Maternal Grandmother    Prostate cancer Maternal Grandfather    Stroke Paternal Grandmother    Seizures Paternal Grandmother    Asthma Paternal Grandmother    Premature birth Daughter    Jaundice Daughter    Social History[1] Allergies[2] Medications Prior to Admission  Medication Sig Dispense Refill Last Dose/Taking   Prenatal Vit-Fe Fumarate-FA (MULTIVITAMIN-PRENATAL) 27-0.8 MG TABS tablet  Take 1 tablet by mouth daily at 12 noon.       I have reviewed patient's Past Medical Hx, Surgical Hx, Family Hx, Social Hx, medications and allergies.   ROS  Pertinent items noted in HPI and remainder of comprehensive ROS otherwise negative.   PHYSICAL EXAM  Patient Vitals for the past 24 hrs:  BP Temp Pulse Resp Height Weight  09/15/24 1518 135/71 98.7 F (37.1 C) 74 18 4' 11 (1.499 m) 72.3 kg    Constitutional: Well-developed, well-nourished female in no acute distress.  Cardiovascular: normal rate & rhythm, warm and well-perfused Respiratory: normal effort, no problems with respiration noted GI: Abd soft, non-tender, non-distended MSK: Extremities nontender, no edema, normal ROM Skin: warm and dry. Acyanotic, no jaundice or pallor. Neurologic: Alert and oriented x 4. No abnormal coordination. Psychiatric: Normal mood. Speech not slurred, not rapid/pressured. Patient is cooperative.  Labs: Results for orders placed or performed during the hospital encounter of 09/15/24 (from the past 24 hours)  Wet prep, genital     Status: None   Collection Time: 09/15/24  3:28 PM  Result Value Ref Range   Yeast Wet Prep HPF POC NONE SEEN NONE SEEN   Trich, Wet Prep NONE SEEN NONE SEEN   Clue Cells Wet Prep HPF POC NONE SEEN NONE SEEN   WBC,  Wet Prep HPF POC <10 <10   Sperm NONE SEEN   Urinalysis, Routine w reflex microscopic -Urine, Clean Catch     Status: Abnormal   Collection Time: 09/15/24  3:28 PM  Result Value Ref Range   Color, Urine YELLOW YELLOW   APPearance CLOUDY (A) CLEAR   Specific Gravity, Urine 1.016 1.005 - 1.030   pH 7.0 5.0 - 8.0   Glucose, UA NEGATIVE NEGATIVE mg/dL   Hgb urine dipstick NEGATIVE NEGATIVE   Bilirubin Urine NEGATIVE NEGATIVE   Ketones, ur NEGATIVE NEGATIVE mg/dL   Protein, ur NEGATIVE NEGATIVE mg/dL   Nitrite NEGATIVE NEGATIVE   Leukocytes,Ua NEGATIVE NEGATIVE  CBC     Status: Abnormal   Collection Time: 09/15/24  3:41 PM  Result Value Ref Range    WBC 12.0 (H) 4.0 - 10.5 K/uL   RBC 4.33 3.87 - 5.11 MIL/uL   Hemoglobin 13.2 12.0 - 15.0 g/dL   HCT 61.4 63.9 - 53.9 %   MCV 88.9 80.0 - 100.0 fL   MCH 30.5 26.0 - 34.0 pg   MCHC 34.3 30.0 - 36.0 g/dL   RDW 87.2 88.4 - 84.4 %   Platelets 364 150 - 400 K/uL   nRBC 0.0 0.0 - 0.2 %  hCG, quantitative, pregnancy     Status: Abnormal   Collection Time: 09/15/24  3:41 PM  Result Value Ref Range   hCG, Beta Chain, Quant, S 115,614 (H) <5 mIU/mL    Imaging:  US  OB Comp Less 14 Wks Result Date: 09/15/2024 CLINICAL DATA:  [id number] Vaginal bleeding in pregnancy, first trimester [id number] EXAM: OBSTETRIC <14 WK ULTRASOUND TECHNIQUE: Transabdominal ultrasound was performed for evaluation of the gestation as well as the maternal uterus and adnexal regions. COMPARISON:  None Available. FINDINGS: Intrauterine gestational sac: Single Yolk sac:  Candidate is visualized on cine image Embryo:  Visualized. Cardiac Activity: Visualized. Heart Rate: 175 bpm CRL:   18.6 mm   8 w 3 d                  US  EDC: 04/24/2025 LMP: 07/01/2024.  Gestational age by LMP is 10 weeks 6 days. Subchorionic hemorrhage: None visualized Right ovary: Unremarkable transabdominal appearance. Estimated to span 3.1 x 1.7 x 2.7 cm for an estimated volume of 7.4 ML Left ovary: Unremarkable transabdominal appearance. Estimated to span 2.9 x 2.1 x 2.9 cm for an estimated volume of 9.1 ML. Other :None Free fluid:  None IMPRESSION: Single intrauterine embryo with cardiac activity documented. Gestational age as assigned by ultrasound is 8 weeks 3 days with an [facility] of 04/24/2025 Electronically Signed   By: Corean Salter M.D.   On: 09/15/2024 17:19    MDM & MAU COURSE  MDM: Moderate  MAU Course: -Vital signs within normal limits. -Ectopic rule out: CBC, US , beta-hCG. Blood type already on file. -Wet prep, UA, and GC/chlamydia to rule out infectious causes. -CBC within normal limits. hCG appropriate. -UA and wet prep negative. -US  with  IUGS, YS, and FP with cardiac activity. CRL measuring [redacted]w[redacted]d, will need to adjust gestational age and EDD.  Differential diagnosis considered for 1st trimester vaginal bleeding includes but is not limited to: ectopic pregnancy, complete spontaneous abortion, incomplete abortion, missed abortion, threatened abortion, embryonic/fetal demise, cervical insufficiency, cervical or vaginal disorder   Orders Placed This Encounter  Procedures   Wet prep, genital   US  OB Comp Less 14 Wks   Urinalysis, Routine w reflex microscopic -Urine, Clean Catch   CBC  hCG, quantitative, pregnancy   Diet NPO time specified   Discharge patient   No orders of the defined types were placed in this encounter.   ASSESSMENT   1. Intrauterine pregnancy   2. Vaginal spotting   3. [redacted] weeks gestation of pregnancy     PLAN  Discharge home in stable condition with return precautions.  Follow up with [name] on 12/23 as scheduled.     Allergies as of 09/15/2024       Reactions   Cinnamon Other (See Comments)   Pt sneezes around cinnamon   Tylenol [acetaminophen] Other (See Comments)   Does not take family history (mother) Unknown if allergy, mom is allergic so she does not give her children tylenol        Medication List     TAKE these medications    multivitamin-prenatal 27-0.8 MG Tabs tablet Take 1 tablet by mouth daily at 12 noon.        Joesph DELENA Sear, PA     [1]  Social History Tobacco Use   Smoking status: Never   Smokeless tobacco: Never  Substance Use Topics   Alcohol use: No    Alcohol/week: 0.0 standard drinks of alcohol   Drug use: No  [2]  Allergies Allergen Reactions   Cinnamon Other (See Comments)    Pt sneezes around cinnamon   Tylenol [Acetaminophen] Other (See Comments)    Does not take family history (mother) Unknown if allergy, mom is allergic so she does not give her children tylenol

## 2024-09-15 NOTE — MAU Note (Signed)
 Lori Powell is a 23 y.o. at [redacted]w[redacted]d here in MAU reporting: vag bleeding when wiping today. Denies any pain or cramping. Had private u/s a few weeks ago but first appointment with ob is next week. Denies any recent intercourse or vag exams.   LMP:  Onset of complaint: 1 hr ago Pain score: 0 Vitals:   09/15/24 1518  BP: 135/71  Pulse: 74  Resp: 18  Temp: 98.7 F (37.1 C)     FHT: unable in triage  Lab orders placed from triage: u/a, wetprep  cg

## 2024-09-16 LAB — GC/CHLAMYDIA PROBE AMP (~~LOC~~) NOT AT ARMC
Chlamydia: NEGATIVE
Comment: NEGATIVE
Comment: NORMAL
Neisseria Gonorrhea: NEGATIVE

## 2024-09-24 ENCOUNTER — Ambulatory Visit: Payer: Self-pay | Admitting: *Deleted

## 2024-09-24 ENCOUNTER — Other Ambulatory Visit (INDEPENDENT_AMBULATORY_CARE_PROVIDER_SITE_OTHER): Payer: Self-pay

## 2024-09-24 VITALS — BP 117/80 | HR 98 | Wt 160.0 lb

## 2024-09-24 DIAGNOSIS — Z3481 Encounter for supervision of other normal pregnancy, first trimester: Secondary | ICD-10-CM

## 2024-09-24 DIAGNOSIS — Z362 Encounter for other antenatal screening follow-up: Secondary | ICD-10-CM

## 2024-09-24 DIAGNOSIS — Z3A09 9 weeks gestation of pregnancy: Secondary | ICD-10-CM | POA: Diagnosis not present

## 2024-09-24 DIAGNOSIS — Z3480 Encounter for supervision of other normal pregnancy, unspecified trimester: Secondary | ICD-10-CM | POA: Insufficient documentation

## 2024-09-24 MED ORDER — PRENATE PIXIE 10-0.6-0.4-200 MG PO CAPS: 1.0000 | ORAL_CAPSULE | Freq: Every day | ORAL | 11 refills | Status: AC

## 2024-09-24 NOTE — Patient Instructions (Signed)

## 2024-09-24 NOTE — Progress Notes (Signed)
 New OB Intake  I connected with Lori Powell  on 09/24/2024 at  9:15 AM EST by In Person Visit and verified that I am speaking with the correct person using two identifiers. Nurse is located at [name] and pt is located at [place].  I discussed the limitations, risks, security and privacy concerns of performing an evaluation and management service by telephone and the availability of in person appointments. I also discussed with the patient that there may be a patient responsible charge related to this service. The patient expressed understanding and agreed to proceed.  I explained I am completing New OB Intake today. We discussed EDD of 04/24/25 based on US  at [redacted]w[redacted]d weeks. Pt is G2P0102. I reviewed her allergies, medications and Medical/Surgical/OB history.    Patient Active Problem List   Diagnosis Date Noted   Female infertility associated with anovulation 05/17/2021   Secondary amenorrhea 05/17/2021   Atopic dermatitis 12/19/2014   Tension headache 07/25/2014   Exercise-induced asthma 09/16/2011     Concerns addressed today  Delivery Plans Plans to deliver at [facility]. Discussed the nature of our practice with multiple providers including residents and students as well as female and female providers. Due to the size of the practice, the delivering provider may not be the same as those providing prenatal care.   Patient is not interested in water birth.  MyChart/Babyscripts MyChart access verified. I explained pt will have some visits in office and some virtually. Babyscripts instructions given and order placed. Patient verifies receipt of registration text/e-mail. Account successfully created and app downloaded. If patient is a candidate for Optimized scheduling, add to sticky note.   Blood Pressure Cuff/Weight Scale Pt has BP cuff at home. Explained after first prenatal appt pt will check weekly and document in Babyscripts. Patient does not have weight scale; patient may purchase  if they desire to track weight weekly in Babyscripts.  Anatomy US  Explained first scheduled US  will be around 19 weeks. Anatomy US  scheduled for TBD at [facility]BD.  Is patient a candidate for Babyscripts Optimization? No, due to Risk Factors   First visit review I reviewed new OB appt with patient. Explained pt will be seen by Nidia Daring, NP at first visit. Discussed [place] genetic screening with patient. Requests Panorama and Horizon.. Routine prenatal labs OB Urine collected at today's visit. Initial prenatal labs deferred to New OB appt.   Last Pap Diagnosis  Date Value Ref Range Status  03/15/2021   Final   - Negative for intraepithelial lesion or malignancy (NILM)    Lori [name], RN 09/24/2024  9:49 AM

## 2024-09-26 LAB — CULTURE, OB URINE

## 2024-09-26 LAB — URINE CULTURE, OB REFLEX

## 2024-10-01 ENCOUNTER — Ambulatory Visit: Payer: Self-pay | Admitting: Obstetrics and Gynecology

## 2024-10-16 ENCOUNTER — Ambulatory Visit: Payer: Self-pay | Admitting: Obstetrics and Gynecology

## 2024-10-16 ENCOUNTER — Other Ambulatory Visit (HOSPITAL_COMMUNITY)
Admission: RE | Admit: 2024-10-16 | Discharge: 2024-10-16 | Disposition: A | Discharge status: Home / Self Care | Source: Ambulatory Visit | Attending: Obstetrics and Gynecology | Admitting: Obstetrics and Gynecology

## 2024-10-16 ENCOUNTER — Encounter: Payer: Self-pay | Admitting: Obstetrics and Gynecology

## 2024-10-16 VITALS — BP 137/81 | HR 99 | Wt 164.6 lb

## 2024-10-16 DIAGNOSIS — Z3A12 12 weeks gestation of pregnancy: Secondary | ICD-10-CM | POA: Diagnosis not present

## 2024-10-16 DIAGNOSIS — O99511 Diseases of the respiratory system complicating pregnancy, first trimester: Secondary | ICD-10-CM

## 2024-10-16 DIAGNOSIS — Z8751 Personal history of pre-term labor: Secondary | ICD-10-CM | POA: Diagnosis not present

## 2024-10-16 DIAGNOSIS — Z8759 Personal history of other complications of pregnancy, childbirth and the puerperium: Secondary | ICD-10-CM

## 2024-10-16 DIAGNOSIS — J45909 Unspecified asthma, uncomplicated: Secondary | ICD-10-CM | POA: Diagnosis not present

## 2024-10-16 DIAGNOSIS — Z3480 Encounter for supervision of other normal pregnancy, unspecified trimester: Secondary | ICD-10-CM | POA: Insufficient documentation

## 2024-10-16 DIAGNOSIS — Z31430 Encounter of female for testing for genetic disease carrier status for procreative management: Secondary | ICD-10-CM | POA: Diagnosis not present

## 2024-10-16 DIAGNOSIS — O219 Vomiting of pregnancy, unspecified: Secondary | ICD-10-CM | POA: Diagnosis not present

## 2024-10-16 DIAGNOSIS — Z3481 Encounter for supervision of other normal pregnancy, first trimester: Secondary | ICD-10-CM

## 2024-10-16 MED ORDER — ONDANSETRON 4 MG PO TBDP: 4.0000 mg | ORAL_TABLET | Freq: Four times a day (QID) | ORAL | 0 refills | Status: AC | PRN

## 2024-10-16 MED ORDER — ASPIRIN 81 MG PO TBEC: 81.0000 mg | DELAYED_RELEASE_TABLET | Freq: Every day | ORAL | 2 refills | Status: AC

## 2024-10-16 MED ORDER — ALBUTEROL SULFATE HFA 108 (90 BASE) MCG/ACT IN AERS: 2.0000 | INHALATION_SPRAY | Freq: Four times a day (QID) | RESPIRATORY_TRACT | 2 refills | Status: AC | PRN

## 2024-10-16 NOTE — Progress Notes (Signed)
 "  INITIAL PRENATAL VISIT  Subjective:   Lori Powell is being seen today for her first obstetrical visit.  She is at [redacted]w[redacted]d gestation by ultrasound Her obstetrical history is significant for history of preterm delivery for twins. Relationship with FOB: significant other, living together. Patient does intend to breast feed. Pregnancy history fully reviewed.  Patient reports nausea.   Pap smear history: 03/15/2021 Objective:    Obstetric History OB History  Gravida Para Term Preterm AB Living  2 1 0 1 0 2  SAB IAB Ectopic Multiple Live Births  0 0 0 1 2    # Outcome Date GA Lbr Len/2nd Weight Sex Type Anes PTL Lv  2 Current           1A Preterm 12/09/15 [redacted]w[redacted]d 12:07 / 01:09 5 lb 8.7 oz (2.515 kg) F Vag-Spont EPI  LIV  1B Preterm 12/09/15 [redacted]w[redacted]d 12:07 / 01:12 5 lb 2.2 oz (2.33 kg) F Vag-Spont EPI  LIV    Past Medical History:  Diagnosis Date   Allergy    seasonal allergies   Asthma    currently uses inhaler; states not often, & doesn't remember day of last use.   Headache    migraines; takes Amitryptiline    Past Surgical History:  Procedure Laterality Date   NO PAST SURGERIES      Medications Ordered Prior to Encounter[1]  Allergies[2]  Social History:  reports that she has never smoked. She has never used smokeless tobacco. She reports that she does not drink alcohol and does not use drugs.  Family History  Problem Relation Age of Onset   Allergies Father    Heart disease Maternal Grandmother    Hypertension Maternal Grandmother    Diabetes Maternal Grandmother    Migraines Maternal Grandmother    Prostate cancer Maternal Grandfather    Stroke Paternal Grandmother    Seizures Paternal Grandmother    Asthma Paternal Grandmother    Premature birth Daughter    Jaundice Daughter     The following portions of the patient's history were reviewed and updated as appropriate: allergies, current medications, past family history, past medical history, past  social history, past surgical history and problem list.  Review of Systems Review of Systems  Gastrointestinal:  Positive for nausea.  All other systems reviewed and are negative.     Physical Exam:  BP 137/81   Pulse 99   Wt 164 lb 9.6 oz (74.7 kg)   LMP 07/01/2024 (Exact Date)   BMI 33.25 kg/m  CONSTITUTIONAL: Well-developed, well-nourished female in no acute distress.  HENT:  Normocephalic, atraumatic.   SKIN: Skin is warm and dry. MUSCULOSKELETAL: Normal range of motion NEUROLOGIC: Alert and oriented  PSYCHIATRIC: Normal mood and affect. Normal behavior.  RESPIRATORY: normal effort ABDOMEN: Soft PELVIC:deferred  Fetal Heart Rate (bpm): 163   Movement: Present       Assessment:    Pregnancy: G2P0102  1. Supervision of other normal pregnancy, antepartum (Primary) BP and FHR normal Discussed recommendation ASA during pregnancy, rx sent, she's going to think about it Declined pap until pp  - Cervicovaginal ancillary only( [facility]) - HgB A1c - CBC/D/Plt+RPR+Rh+ABO+RubIgG... - aspirin  EC 81 MG tablet; Take 1 tablet (81 mg total) by mouth daily. Start taking when you are [redacted] weeks pregnant for rest of pregnancy for prevention of preeclampsia  Dispense: 300 tablet; Refill: 2  2. History of preterm delivery 3. History of twin pregnancy in prior pregnancy Twin delivery induced at [redacted]w[redacted]d  4. [redacted] weeks gestation of pregnancy   5. Encounter of female for testing for genetic disease carrier status for procreative management  - HORIZON Basic Panel  6. Encounter for supervision of other normal pregnancy in first trimester  - PANORAMA PRENATAL TEST  7. Asthma during pregnancy Hx of asthma,used inhaler previously - albuterol  (VENTOLIN  HFA) 108 (90 Base) MCG/ACT inhaler; Inhale 2 puffs into the lungs every 6 (six) hours as needed for wheezing or shortness of breath.  Dispense: 8 g; Refill: 2  8. Nausea and vomiting during pregnancy Trial zofran   - ondansetron   (ZOFRAN -ODT) 4 MG disintegrating tablet; Take 1 tablet (4 mg total) by mouth every 6 (six) hours as needed for nausea.  Dispense: 20 tablet; Refill: 0  Plan:     Initial labs drawn. Prenatal vitamins. Problem list reviewed and updated. Reviewed in detail the nature of the practice with collaborative care between  Genetic screening discussed: NIPS/First trimester screen/Quad/AFP ordered. Role of ultrasound in pregnancy discussed; Anatomy US : ordered. Discussed clinic routines, schedule of care and testing, genetic screening options, involvement of students and residents under the direct supervision of APPs and doctors and presence of female providers. Pt verbalized understanding.  Future Appointments  Date Time Provider Department Center  11/13/2024  9:35 AM Eveline Lynwood MATSU, MD [facility] None  11/28/2024  8:00 AM [facility] PROVIDER 1 [facility]  11/28/2024  8:30 AM [facility] US1 [facility]    Lori Nidia CROME, FNP        [1]  Current Outpatient Medications on File Prior to Visit  Medication Sig Dispense Refill   Prenat-FeAsp-Meth-FA-DHA w/o A (PRENATE PIXIE ) 10-0.6-0.4-200 MG CAPS Take 1 tablet by mouth daily. 30 capsule 11   Prenatal Vit-Fe Fumarate-FA (MULTIVITAMIN-PRENATAL) 27-0.8 MG TABS tablet Take 1 tablet by mouth daily at 12 noon. (Patient not taking: Reported on 10/16/2024)     No current facility-administered medications on file prior to visit.  [2]  Allergies Allergen Reactions   Cinnamon Other (See Comments)    Pt sneezes around cinnamon   "

## 2024-10-16 NOTE — Progress Notes (Signed)
 Pt presents for NOB visit. C/o nausea. Wants to defer PAP PP

## 2024-10-17 ENCOUNTER — Ambulatory Visit: Payer: Self-pay | Admitting: Obstetrics and Gynecology

## 2024-10-17 DIAGNOSIS — Z3480 Encounter for supervision of other normal pregnancy, unspecified trimester: Secondary | ICD-10-CM

## 2024-10-17 DIAGNOSIS — O99810 Abnormal glucose complicating pregnancy: Secondary | ICD-10-CM | POA: Insufficient documentation

## 2024-10-17 LAB — HCV INTERPRETATION

## 2024-10-17 LAB — CBC/D/PLT+RPR+RH+ABO+RUBIGG...
Antibody Screen: NEGATIVE
Basophils Absolute: 0 x10E3/uL (ref 0.0–0.2)
Basos: 0 %
EOS (ABSOLUTE): 0.1 x10E3/uL (ref 0.0–0.4)
Eos: 1 %
HCV Ab: NONREACTIVE
HIV Screen 4th Generation wRfx: NONREACTIVE
Hematocrit: 37.7 % (ref 34.0–46.6)
Hemoglobin: 12.3 g/dL (ref 11.1–15.9)
Hepatitis B Surface Ag: NEGATIVE
Immature Grans (Abs): 0 x10E3/uL (ref 0.0–0.1)
Immature Granulocytes: 0 %
Lymphocytes Absolute: 2.4 x10E3/uL (ref 0.7–3.1)
Lymphs: 24 %
MCH: 29.7 pg (ref 26.6–33.0)
MCHC: 32.6 g/dL (ref 31.5–35.7)
MCV: 91 fL (ref 79–97)
Monocytes Absolute: 0.7 x10E3/uL (ref 0.1–0.9)
Monocytes: 7 %
Neutrophils Absolute: 6.7 x10E3/uL (ref 1.4–7.0)
Neutrophils: 68 %
Platelets: 351 x10E3/uL (ref 150–450)
RBC: 4.14 x10E6/uL (ref 3.77–5.28)
RDW: 12.7 % (ref 11.7–15.4)
RPR Ser Ql: NONREACTIVE
Rh Factor: POSITIVE
Rubella Antibodies, IGG: 2.94 {index}
WBC: 9.9 x10E3/uL (ref 3.4–10.8)

## 2024-10-17 LAB — CERVICOVAGINAL ANCILLARY ONLY
Chlamydia: NEGATIVE
Comment: NEGATIVE
Comment: NEGATIVE
Comment: NORMAL
Neisseria Gonorrhea: NEGATIVE
Trichomonas: NEGATIVE

## 2024-10-17 LAB — HEMOGLOBIN A1C
Est. average glucose Bld gHb Est-mCnc: 117 mg/dL
Hgb A1c MFr Bld: 5.7 % — ABNORMAL HIGH (ref 4.8–5.6)

## 2024-10-23 LAB — PANORAMA PRENATAL TEST FULL PANEL:PANORAMA TEST PLUS 5 ADDITIONAL MICRODELETIONS: FETAL FRACTION: 9.8

## 2024-10-27 LAB — HORIZON CUSTOM: REPORT SUMMARY: NEGATIVE

## 2024-11-13 ENCOUNTER — Encounter: Payer: Self-pay | Admitting: Obstetrics & Gynecology

## 2024-11-28 ENCOUNTER — Other Ambulatory Visit

## 2024-11-28 ENCOUNTER — Ambulatory Visit
